# Patient Record
Sex: Male | Born: 1935 | Race: White | Hispanic: No | Marital: Married | Smoking: Never smoker
Health system: Southern US, Community
[De-identification: ages and names within clinical notes are randomized; demographics above are authoritative.]

## PROBLEM LIST (undated history)

## (undated) DIAGNOSIS — IMO0001 Reserved for inherently not codable concepts without codable children: Secondary | ICD-10-CM

## (undated) DIAGNOSIS — F419 Anxiety disorder, unspecified: Secondary | ICD-10-CM

## (undated) DIAGNOSIS — E785 Hyperlipidemia, unspecified: Secondary | ICD-10-CM

## (undated) DIAGNOSIS — K219 Gastro-esophageal reflux disease without esophagitis: Secondary | ICD-10-CM

## (undated) DIAGNOSIS — I219 Acute myocardial infarction, unspecified: Secondary | ICD-10-CM

## (undated) DIAGNOSIS — M722 Plantar fascial fibromatosis: Secondary | ICD-10-CM

## (undated) DIAGNOSIS — I2510 Atherosclerotic heart disease of native coronary artery without angina pectoris: Secondary | ICD-10-CM

## (undated) DIAGNOSIS — C189 Malignant neoplasm of colon, unspecified: Secondary | ICD-10-CM

## (undated) DIAGNOSIS — R079 Chest pain, unspecified: Secondary | ICD-10-CM

## (undated) DIAGNOSIS — I10 Essential (primary) hypertension: Secondary | ICD-10-CM

## (undated) DIAGNOSIS — W3400XA Accidental discharge from unspecified firearms or gun, initial encounter: Secondary | ICD-10-CM

## (undated) HISTORY — DX: Essential (primary) hypertension: I10

## (undated) HISTORY — DX: Chest pain, unspecified: R07.9

## (undated) HISTORY — PX: COLON SURGERY: SHX602

## (undated) HISTORY — PX: CORONARY ANGIOPLASTY WITH STENT PLACEMENT: SHX49

## (undated) HISTORY — DX: Plantar fascial fibromatosis: M72.2

## (undated) HISTORY — DX: Malignant neoplasm of colon, unspecified: C18.9

## (undated) HISTORY — PX: OTHER SURGICAL HISTORY: SHX169

## (undated) HISTORY — PX: CHOLECYSTECTOMY: SHX55

## (undated) HISTORY — DX: Accidental discharge from unspecified firearms or gun, initial encounter: W34.00XA

## (undated) HISTORY — DX: Gastro-esophageal reflux disease without esophagitis: K21.9

## (undated) HISTORY — DX: Reserved for inherently not codable concepts without codable children: IMO0001

## (undated) HISTORY — DX: Acute myocardial infarction, unspecified: I21.9

## (undated) HISTORY — DX: Anxiety disorder, unspecified: F41.9

## (undated) HISTORY — PX: BACK SURGERY: SHX140

---

## 2003-12-25 ENCOUNTER — Other Ambulatory Visit: Payer: Self-pay

## 2004-04-29 ENCOUNTER — Ambulatory Visit: Payer: Self-pay | Admitting: Otolaryngology

## 2004-05-04 ENCOUNTER — Ambulatory Visit: Payer: Self-pay | Admitting: Otolaryngology

## 2004-08-28 ENCOUNTER — Emergency Department: Payer: Self-pay | Admitting: Emergency Medicine

## 2004-11-11 ENCOUNTER — Ambulatory Visit: Payer: Self-pay | Admitting: Internal Medicine

## 2005-04-24 ENCOUNTER — Ambulatory Visit: Payer: Self-pay | Admitting: Gastroenterology

## 2005-07-19 ENCOUNTER — Inpatient Hospital Stay: Payer: Self-pay | Admitting: Internal Medicine

## 2005-07-19 ENCOUNTER — Other Ambulatory Visit: Payer: Self-pay

## 2005-08-31 ENCOUNTER — Ambulatory Visit: Payer: Self-pay | Admitting: Gastroenterology

## 2005-09-27 ENCOUNTER — Ambulatory Visit: Payer: Self-pay

## 2006-03-23 ENCOUNTER — Ambulatory Visit: Payer: Self-pay | Admitting: Internal Medicine

## 2006-04-04 ENCOUNTER — Inpatient Hospital Stay: Payer: Self-pay | Admitting: Internal Medicine

## 2006-04-04 ENCOUNTER — Other Ambulatory Visit: Payer: Self-pay

## 2006-04-20 ENCOUNTER — Emergency Department: Payer: Self-pay | Admitting: Emergency Medicine

## 2006-04-20 ENCOUNTER — Other Ambulatory Visit: Payer: Self-pay

## 2006-04-30 ENCOUNTER — Ambulatory Visit: Payer: Self-pay | Admitting: Gastroenterology

## 2006-05-09 ENCOUNTER — Other Ambulatory Visit: Payer: Self-pay

## 2006-05-10 ENCOUNTER — Inpatient Hospital Stay: Payer: Self-pay | Admitting: Internal Medicine

## 2006-07-20 ENCOUNTER — Ambulatory Visit: Payer: Self-pay | Admitting: Internal Medicine

## 2006-07-28 ENCOUNTER — Ambulatory Visit: Payer: Self-pay | Admitting: Internal Medicine

## 2006-10-05 ENCOUNTER — Other Ambulatory Visit: Payer: Self-pay

## 2006-10-05 ENCOUNTER — Emergency Department: Payer: Self-pay | Admitting: Emergency Medicine

## 2007-01-08 ENCOUNTER — Ambulatory Visit: Payer: Self-pay | Admitting: Internal Medicine

## 2007-08-15 ENCOUNTER — Ambulatory Visit: Payer: Self-pay | Admitting: Unknown Physician Specialty

## 2007-09-12 ENCOUNTER — Ambulatory Visit: Payer: Self-pay | Admitting: Gastroenterology

## 2009-05-28 ENCOUNTER — Ambulatory Visit: Payer: Self-pay | Admitting: Unknown Physician Specialty

## 2010-05-25 ENCOUNTER — Ambulatory Visit: Payer: Self-pay | Admitting: Internal Medicine

## 2010-06-21 ENCOUNTER — Ambulatory Visit: Payer: Self-pay | Admitting: Urology

## 2010-06-27 ENCOUNTER — Ambulatory Visit: Payer: Self-pay | Admitting: Urology

## 2010-09-21 ENCOUNTER — Ambulatory Visit: Payer: Self-pay | Admitting: Internal Medicine

## 2010-11-21 ENCOUNTER — Emergency Department: Payer: Self-pay | Admitting: Emergency Medicine

## 2011-05-25 ENCOUNTER — Ambulatory Visit: Payer: Self-pay | Admitting: Unknown Physician Specialty

## 2011-06-05 ENCOUNTER — Ambulatory Visit: Payer: Self-pay | Admitting: Unknown Physician Specialty

## 2011-06-05 LAB — PROTIME-INR: INR: 1.1

## 2012-05-02 ENCOUNTER — Ambulatory Visit: Payer: Self-pay | Admitting: Otolaryngology

## 2012-06-06 ENCOUNTER — Ambulatory Visit: Payer: Self-pay | Admitting: Internal Medicine

## 2012-09-26 ENCOUNTER — Ambulatory Visit: Payer: Self-pay | Admitting: Specialist

## 2012-09-26 LAB — CBC WITH DIFFERENTIAL/PLATELET
Basophil #: 0.1 10*3/uL (ref 0.0–0.1)
HCT: 35 % — ABNORMAL LOW (ref 40.0–52.0)
HGB: 12.4 g/dL — ABNORMAL LOW (ref 13.0–18.0)
Lymphocyte #: 2 10*3/uL (ref 1.0–3.6)
Lymphocyte %: 35.2 %
MCHC: 35.6 g/dL (ref 32.0–36.0)
MCV: 90 fL (ref 80–100)
RBC: 3.87 10*6/uL — ABNORMAL LOW (ref 4.40–5.90)
RDW: 14.3 % (ref 11.5–14.5)
WBC: 5.6 10*3/uL (ref 3.8–10.6)

## 2012-09-26 LAB — BASIC METABOLIC PANEL
Anion Gap: 6 — ABNORMAL LOW (ref 7–16)
BUN: 20 mg/dL — ABNORMAL HIGH (ref 7–18)
Calcium, Total: 9.3 mg/dL (ref 8.5–10.1)
Chloride: 109 mmol/L — ABNORMAL HIGH (ref 98–107)
Co2: 23 mmol/L (ref 21–32)
Creatinine: 0.83 mg/dL (ref 0.60–1.30)
EGFR (African American): >60
Glucose: 89 mg/dL (ref 65–99)

## 2012-10-02 ENCOUNTER — Ambulatory Visit: Payer: Self-pay | Admitting: Specialist

## 2013-02-05 ENCOUNTER — Ambulatory Visit: Payer: Self-pay | Admitting: Urology

## 2013-02-17 ENCOUNTER — Encounter: Payer: Self-pay | Admitting: Podiatry

## 2013-02-17 ENCOUNTER — Ambulatory Visit: Payer: Self-pay | Admitting: Podiatry

## 2013-03-12 ENCOUNTER — Encounter: Payer: Self-pay | Admitting: Podiatry

## 2013-03-12 ENCOUNTER — Ambulatory Visit (INDEPENDENT_AMBULATORY_CARE_PROVIDER_SITE_OTHER): Payer: Medicare PPO | Admitting: Podiatry

## 2013-03-12 VITALS — BP 91/44 | HR 57 | Resp 16 | Ht 70.0 in | Wt 215.0 lb

## 2013-03-12 DIAGNOSIS — M722 Plantar fascial fibromatosis: Secondary | ICD-10-CM

## 2013-03-12 DIAGNOSIS — IMO0002 Reserved for concepts with insufficient information to code with codable children: Secondary | ICD-10-CM

## 2013-03-12 DIAGNOSIS — M792 Neuralgia and neuritis, unspecified: Secondary | ICD-10-CM

## 2013-03-12 NOTE — Progress Notes (Signed)
Mr. Vanderwall presents today still complaining of pain to his right heel.  Objective: Vital signs are stable he is alert and oriented x3 pulses remain palpable to the right lower extremity. He has pain on palpation of the medial calcaneal tubercle of the right heel. This is indicative of plantar fasciitis or [name] nerve entrapment.  Assessment: Intractable plantar fasciitis right can't rule out a neuritis associated with [name] nerve entrapment.  Plan: He will continue to wear his orthotics a regular basis. At this point we did initiate alcohol injection therapy to the point of maximum tenderness today. I will followup with him in one month.

## 2013-04-09 ENCOUNTER — Ambulatory Visit: Payer: Medicare PPO | Admitting: Podiatry

## 2013-04-16 ENCOUNTER — Encounter: Payer: Self-pay | Admitting: Podiatry

## 2013-04-16 ENCOUNTER — Ambulatory Visit (INDEPENDENT_AMBULATORY_CARE_PROVIDER_SITE_OTHER): Payer: Medicare PPO | Admitting: Podiatry

## 2013-04-16 VITALS — BP 126/66 | HR 61 | Resp 16 | Ht 70.0 in | Wt 216.0 lb

## 2013-04-16 DIAGNOSIS — M792 Neuralgia and neuritis, unspecified: Secondary | ICD-10-CM

## 2013-04-16 DIAGNOSIS — M722 Plantar fascial fibromatosis: Secondary | ICD-10-CM

## 2013-04-16 DIAGNOSIS — IMO0002 Reserved for concepts with insufficient information to code with codable children: Secondary | ICD-10-CM

## 2013-04-16 NOTE — Progress Notes (Signed)
Mr. Arcia presents today for followup of his sclerosing therapy to the plantar aspect of his right heel. He states it has good days and bad days he seems to be better than it has been previously.  Objective: Vital signs are stable he is alert and oriented x3. He has mild tenderness on palpation of the fractures nerve at the plantar fascia of the right heel.  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  Plan: Second injection of dehydrated alcohol to his right heel. Followup with him in 3 week

## 2013-04-30 ENCOUNTER — Ambulatory Visit (INDEPENDENT_AMBULATORY_CARE_PROVIDER_SITE_OTHER): Payer: Medicare PPO | Admitting: Podiatry

## 2013-04-30 ENCOUNTER — Encounter: Payer: Self-pay | Admitting: Podiatry

## 2013-04-30 VITALS — BP 109/62 | HR 56 | Resp 16 | Ht 70.0 in | Wt 215.0 lb

## 2013-04-30 DIAGNOSIS — G5781 Other specified mononeuropathies of right lower limb: Secondary | ICD-10-CM

## 2013-04-30 DIAGNOSIS — G5760 Lesion of plantar nerve, unspecified lower limb: Secondary | ICD-10-CM

## 2013-04-30 NOTE — Progress Notes (Signed)
Larry Rosario presents today for followup of his neuritis to [name] nerve of his right heel. He states is about 30-40% better.  Objective: Vital signs are stable he is alert and oriented x3 pulses remain palpable right foot. Mild tenderness on palpation medial calcaneal tubercle of the right heel.  Assessment: [name] neuritis with plantar fasciitis right.  Plan: Injected dehydrated alcohol to the painful nerve right heel. Followup with him in 3 weeks

## 2013-05-21 ENCOUNTER — Encounter: Payer: Self-pay | Admitting: Podiatry

## 2013-05-21 ENCOUNTER — Ambulatory Visit (INDEPENDENT_AMBULATORY_CARE_PROVIDER_SITE_OTHER): Payer: Medicare PPO | Admitting: Podiatry

## 2013-05-21 VITALS — BP 116/66 | HR 70 | Resp 16

## 2013-05-21 DIAGNOSIS — IMO0002 Reserved for concepts with insufficient information to code with codable children: Secondary | ICD-10-CM

## 2013-05-21 DIAGNOSIS — M792 Neuralgia and neuritis, unspecified: Secondary | ICD-10-CM

## 2013-05-21 NOTE — Progress Notes (Signed)
   Subjective:    Patient ID: Larry Rosario, male    DOB: [birth year], 80 y.o.   MRN: [id number]  HPI Comments: Right heel still bothers some , but it is slowly getting better      Review of Systems     Objective:   Physical Exam: Vital signs are stable he is alert and oriented x3. Pulses are palpable right lower extremity. Minimal tenderness on palpation medial continued tubercle midline of the calcaneus.        Assessment & Plan:  Assessment: Neuritis or [name] nerve with fasciitis right.  Plan: Reinjected another dose of dehydrated alcohol today. All with him in 3 weeks. Remembered to ask him house tripped she [place] and [place] was.

## 2013-06-11 ENCOUNTER — Encounter: Payer: Self-pay | Admitting: Podiatry

## 2013-06-11 ENCOUNTER — Ambulatory Visit (INDEPENDENT_AMBULATORY_CARE_PROVIDER_SITE_OTHER): Payer: Medicare PPO | Admitting: Podiatry

## 2013-06-11 VITALS — BP 104/62 | HR 60 | Resp 16

## 2013-06-11 DIAGNOSIS — M792 Neuralgia and neuritis, unspecified: Secondary | ICD-10-CM

## 2013-06-11 DIAGNOSIS — IMO0002 Reserved for concepts with insufficient information to code with codable children: Secondary | ICD-10-CM
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
## 2013-07-02 ENCOUNTER — Ambulatory Visit: Payer: Medicare PPO | Admitting: Podiatry

## 2014-09-04 NOTE — Op Note (Signed)
PATIENT NAME:  Larry Rosario, Larry Rosario Boruch R MR#:  [id number] DATE OF BIRTH:  Mar 25, 1932  DATE OF PROCEDURE:  10/02/2012  PREOPERATIVE DIAGNOSIS: Left carpal tunnel syndrome.   POSTOPERATIVE DIAGNOSIS: Left carpal tunnel syndrome.   PROCEDURE: Left carpal tunnel release.   SURGEON: Christophe Louis, M.D.   ANESTHESIA: General.   COMPLICATIONS: None.   TOURNIQUET TIME: 17 minutes.   ESTIMATED BLOOD LOSS: None.   PROCEDURE: After adequate induction of general anesthesia the left upper extremity was thoroughly prepped with alcohol and ChloraPrep and draped in standard sterile fashion. The extremity was wrapped out with the Esmarch bandage and a pneumatic tourniquet elevated to 250 mmHg. Under loupe magnification, standard volar carpal tunnel incision was made and the dissection carried down to the transverse retinacular ligament. The ligament was incised with the knife in the mid-portion. The distal release was performed with the small scissors. The proximal release was performed with the small scissors and the carpal tunnel scissors.   A careful check was made both proximally and distally to ensure that complete release had been obtained. There was moderate compression of the nerve directly beneath the ligament. There was minimal synovitis present and no mass lesions.   The wound was thoroughly irrigated multiple times. Skin edges were infiltrated with 0.5% plain Marcaine. The skin was closed with 4-0 nylon. A soft bulky dressing was applied. The tourniquet was released and the patient was returned to the recovery room in satisfactory condition, having tolerated the procedure quite well.    ____________________________ Lucas Mallow, MD ces:dm D: 10/02/2012 08:24:08 ET T: 10/02/2012 08:30:32 ET JOB#: [id number]  cc: Lucas Mallow, MD, <Dictator> Lucas Mallow MD ELECTRONICALLY SIGNED 10/02/2012 10:12

## 2014-11-05 ENCOUNTER — Encounter: Payer: Self-pay | Admitting: *Deleted

## 2014-11-06 ENCOUNTER — Encounter: Payer: Self-pay | Admitting: Anesthesiology

## 2014-11-06 ENCOUNTER — Ambulatory Visit: Payer: Medicare PPO | Admitting: Anesthesiology

## 2014-11-06 ENCOUNTER — Ambulatory Visit
Admission: RE | Admit: 2014-11-06 | Discharge: 2014-11-06 | Disposition: A | Discharge status: Home / Self Care | Payer: Medicare PPO | Source: Ambulatory Visit | Attending: Unknown Physician Specialty | Admitting: Unknown Physician Specialty

## 2014-11-06 ENCOUNTER — Encounter
Admission: RE | Disposition: A | Discharge status: Home / Self Care | Payer: Self-pay | Source: Ambulatory Visit | Attending: Unknown Physician Specialty

## 2014-11-06 DIAGNOSIS — F419 Anxiety disorder, unspecified: Secondary | ICD-10-CM | POA: Insufficient documentation

## 2014-11-06 DIAGNOSIS — I2510 Atherosclerotic heart disease of native coronary artery without angina pectoris: Secondary | ICD-10-CM | POA: Diagnosis not present

## 2014-11-06 DIAGNOSIS — Z955 Presence of coronary angioplasty implant and graft: Secondary | ICD-10-CM | POA: Diagnosis not present

## 2014-11-06 DIAGNOSIS — Z7982 Long term (current) use of aspirin: Secondary | ICD-10-CM | POA: Diagnosis not present

## 2014-11-06 DIAGNOSIS — I252 Old myocardial infarction: Secondary | ICD-10-CM | POA: Diagnosis not present

## 2014-11-06 DIAGNOSIS — K219 Gastro-esophageal reflux disease without esophagitis: Secondary | ICD-10-CM | POA: Insufficient documentation

## 2014-11-06 DIAGNOSIS — Z08 Encounter for follow-up examination after completed treatment for malignant neoplasm: Secondary | ICD-10-CM | POA: Insufficient documentation

## 2014-11-06 DIAGNOSIS — Z85038 Personal history of other malignant neoplasm of large intestine: Secondary | ICD-10-CM | POA: Insufficient documentation

## 2014-11-06 DIAGNOSIS — K5730 Diverticulosis of large intestine without perforation or abscess without bleeding: Secondary | ICD-10-CM | POA: Insufficient documentation

## 2014-11-06 DIAGNOSIS — I10 Essential (primary) hypertension: Secondary | ICD-10-CM | POA: Diagnosis not present

## 2014-11-06 DIAGNOSIS — Z79899 Other long term (current) drug therapy: Secondary | ICD-10-CM | POA: Diagnosis not present

## 2014-11-06 DIAGNOSIS — E785 Hyperlipidemia, unspecified: Secondary | ICD-10-CM | POA: Insufficient documentation

## 2014-11-06 DIAGNOSIS — K640 First degree hemorrhoids: Secondary | ICD-10-CM | POA: Diagnosis not present

## 2014-11-06 HISTORY — DX: Atherosclerotic heart disease of native coronary artery without angina pectoris: I25.10

## 2014-11-06 HISTORY — DX: Gastro-esophageal reflux disease without esophagitis: K21.9

## 2014-11-06 HISTORY — PX: COLONOSCOPY WITH PROPOFOL: SHX5780

## 2014-11-06 HISTORY — DX: Hyperlipidemia, unspecified: E78.5

## 2014-11-06 SURGERY — COLONOSCOPY WITH PROPOFOL
Anesthesia: General

## 2014-11-06 MED ORDER — EPHEDRINE SULFATE 50 MG/ML IJ SOLN
INTRAMUSCULAR | Status: DC | PRN
  Administered 2014-11-06: 10 mg via INTRAVENOUS
  Administered 2014-11-06: 15 mg via INTRAVENOUS

## 2014-11-06 MED ORDER — SODIUM CHLORIDE 0.9 % IV SOLN: INTRAVENOUS | Status: DC

## 2014-11-06 MED ORDER — LIDOCAINE HCL (CARDIAC) 20 MG/ML IV SOLN
INTRAVENOUS | Status: DC | PRN
  Administered 2014-11-06: 60 mg via INTRAVENOUS

## 2014-11-06 MED ORDER — LACTATED RINGERS IV SOLN
INTRAVENOUS | Status: DC | PRN
  Administered 2014-11-06: 09:00:00 via INTRAVENOUS

## 2014-11-06 MED ORDER — MIDAZOLAM HCL 2 MG/2ML IJ SOLN
INTRAMUSCULAR | Status: DC | PRN
  Administered 2014-11-06: 1 mg via INTRAVENOUS

## 2014-11-06 MED ORDER — PROPOFOL INFUSION 10 MG/ML OPTIME
INTRAVENOUS | Status: DC | PRN
  Administered 2014-11-06: 140 ug/kg/min via INTRAVENOUS
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
## 2014-11-06 NOTE — Anesthesia Postprocedure Evaluation (Signed)
  Anesthesia Post-op Note  Patient: Larry Rosario  Procedure(s) Performed: Procedure(s): COLONOSCOPY WITH PROPOFOL (N/A)  Anesthesia type:General  Patient location: PACU  Post pain: Pain level controlled  Post assessment: Post-op Vital signs reviewed, Patient's Cardiovascular Status Stable, Respiratory Function Stable, Patent Airway and No signs of Nausea or vomiting  Post vital signs: Reviewed and stable  Last Vitals:  Filed Vitals:   11/06/14 1025  BP:   Pulse: 53  Temp:   Resp: 15    Level of consciousness: awake, alert  and patient cooperative  Complications: No apparent anesthesia complications

## 2014-11-06 NOTE — Transfer of Care (Signed)
Immediate Anesthesia Transfer of Care Note  Patient: Larry Rosario  Procedure(s) Performed: Procedure(s): COLONOSCOPY WITH PROPOFOL (N/A)  Patient Location: PACU and Endoscopy Unit  Anesthesia Type:General  Level of Consciousness: awake, alert  and oriented  Airway & Oxygen Therapy: Patient Spontanous Breathing and Patient connected to nasal cannula oxygen  Post-op Assessment: Report given to RN  Post vital signs: Reviewed and stable  Last Vitals: 55 hr 98% sat 112/59 18r Filed Vitals:   11/06/14 0901  BP: 116/61  Temp: 89.8 C    Complications: No apparent anesthesia complications

## 2014-11-06 NOTE — Op Note (Signed)
[facility] Gastroenterology Patient Name: Mirl Hillery Procedure Date: 11/06/2014 9:18 AM MRN: [id number] Account #: [id number] Date of Birth: 05-Nov-1931 Admit Type: Outpatient Age: 81 Room: [facility] ENDO ROOM 1 Gender: Male Note Status: Finalized Procedure:         Colonoscopy Indications:       Personal history of malignant neoplasm of the colon Providers:         Manya Silvas, MD Referring MD:      Hewitt Blade. Sarina Ser, MD (Referring MD) Medicines:         Propofol per Anesthesia Complications:     No immediate complications. Procedure:         Pre-Anesthesia Assessment:                    - After reviewing the risks and benefits, the patient was                     deemed in satisfactory condition to undergo the procedure.                    After obtaining informed consent, the colonoscope was                     passed under direct vision. Throughout the procedure, the                     patient's blood pressure, pulse, and oxygen saturations                     were monitored continuously. The Olympus PCF-160AL                     colonoscope (S#. [id number]) was introduced through the anus                     and advanced to the the ileocolonic anastomosis. The                     colonoscopy was performed without difficulty. The patient                     tolerated the procedure well. The quality of the bowel                     preparation was good. Findings:      Multiple small and large-mouthed diverticula were found in the sigmoid       colon, in the descending colon and in the transverse colon. Mostly small.      Internal hemorrhoids were found during endoscopy. The hemorrhoids were       small and Grade I (internal hemorrhoids that do not prolapse). Impression:        - Diverticulosis in the sigmoid colon, in the descending                     colon and in the transverse colon.                    - Internal hemorrhoids.                    - No  specimens collected. Recommendation:    - The findings and recommendations were discussed with the  patient's family. Manya Silvas, MD 11/06/2014 9:47:36 AM This report has been signed electronically. Number of Addenda: 0 Note Initiated On: 11/06/2014 9:18 AM Scope Withdrawal Time: 0 hours 9 minutes 46 seconds  Total Procedure Duration: 0 hours 14 minutes 48 seconds       [facility]
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
## 2014-11-09 ENCOUNTER — Encounter: Payer: Self-pay | Admitting: Unknown Physician Specialty

## 2015-03-24 ENCOUNTER — Other Ambulatory Visit: Payer: Self-pay | Admitting: Orthopedic Surgery

## 2015-03-24 DIAGNOSIS — M19011 Primary osteoarthritis, right shoulder: Secondary | ICD-10-CM

## 2015-04-05 ENCOUNTER — Ambulatory Visit
Admission: RE | Admit: 2015-04-05 | Discharge: 2015-04-05 | Disposition: A | Discharge status: Home / Self Care | Payer: Medicare PPO | Source: Ambulatory Visit | Attending: Orthopedic Surgery | Admitting: Orthopedic Surgery

## 2015-04-05 DIAGNOSIS — M19011 Primary osteoarthritis, right shoulder: Secondary | ICD-10-CM | POA: Insufficient documentation

## 2015-04-05 DIAGNOSIS — W19XXXA Unspecified fall, initial encounter: Secondary | ICD-10-CM | POA: Insufficient documentation

## 2015-04-05 DIAGNOSIS — M6281 Muscle weakness (generalized): Secondary | ICD-10-CM | POA: Insufficient documentation

## 2015-04-05 MED ORDER — IOHEXOL 180 MG/ML  SOLN: 5.0000 mL | Freq: Once | INTRAMUSCULAR | Status: DC | PRN

## 2015-10-13 ENCOUNTER — Other Ambulatory Visit: Payer: Self-pay | Admitting: Urology

## 2015-10-13 DIAGNOSIS — N401 Enlarged prostate with lower urinary tract symptoms: Secondary | ICD-10-CM

## 2015-10-13 DIAGNOSIS — N138 Other obstructive and reflux uropathy: Secondary | ICD-10-CM

## 2015-10-13 DIAGNOSIS — R351 Nocturia: Secondary | ICD-10-CM

## 2015-10-13 DIAGNOSIS — R3915 Urgency of urination: Secondary | ICD-10-CM

## 2015-10-13 DIAGNOSIS — R310 Gross hematuria: Secondary | ICD-10-CM

## 2015-10-13 DIAGNOSIS — N403 Nodular prostate with lower urinary tract symptoms: Secondary | ICD-10-CM

## 2015-10-13 DIAGNOSIS — R3914 Feeling of incomplete bladder emptying: Secondary | ICD-10-CM

## 2015-10-20 ENCOUNTER — Ambulatory Visit
Admission: RE | Admit: 2015-10-20 | Discharge: 2015-10-20 | Disposition: A | Discharge status: Home / Self Care | Payer: Medicare Other | Source: Ambulatory Visit | Attending: Urology | Admitting: Urology

## 2015-10-20 DIAGNOSIS — N401 Enlarged prostate with lower urinary tract symptoms: Secondary | ICD-10-CM

## 2015-10-20 DIAGNOSIS — E669 Obesity, unspecified: Secondary | ICD-10-CM | POA: Diagnosis not present

## 2015-10-20 DIAGNOSIS — N138 Other obstructive and reflux uropathy: Secondary | ICD-10-CM

## 2015-10-20 DIAGNOSIS — R310 Gross hematuria: Secondary | ICD-10-CM | POA: Diagnosis present

## 2015-10-20 DIAGNOSIS — N329 Bladder disorder, unspecified: Secondary | ICD-10-CM | POA: Diagnosis not present

## 2015-10-20 DIAGNOSIS — N1339 Other hydronephrosis: Secondary | ICD-10-CM | POA: Diagnosis not present

## 2015-10-20 DIAGNOSIS — R351 Nocturia: Secondary | ICD-10-CM

## 2015-10-20 DIAGNOSIS — R3915 Urgency of urination: Secondary | ICD-10-CM | POA: Diagnosis not present

## 2015-10-20 DIAGNOSIS — I2510 Atherosclerotic heart disease of native coronary artery without angina pectoris: Secondary | ICD-10-CM | POA: Insufficient documentation

## 2015-10-20 DIAGNOSIS — N403 Nodular prostate with lower urinary tract symptoms: Secondary | ICD-10-CM

## 2015-10-20 DIAGNOSIS — R932 Abnormal findings on diagnostic imaging of liver and biliary tract: Secondary | ICD-10-CM | POA: Insufficient documentation

## 2015-10-20 DIAGNOSIS — R3914 Feeling of incomplete bladder emptying: Secondary | ICD-10-CM | POA: Insufficient documentation

## 2015-10-20 LAB — POCT I-STAT CREATININE: Creatinine, Ser: 1.7 mg/dL — ABNORMAL HIGH (ref 0.61–1.24)

## 2015-10-20 MED ORDER — IOPAMIDOL (ISOVUE-300) INJECTION 61%
125.0000 mL | Freq: Once | INTRAVENOUS | Status: AC | PRN
  Administered 2015-10-20: 100 mL via INTRAVENOUS

## 2016-02-21 ENCOUNTER — Encounter
Admission: RE | Admit: 2016-02-21 | Discharge: 2016-02-21 | Disposition: A | Discharge status: Home / Self Care | Payer: Medicare PPO | Source: Ambulatory Visit | Attending: Internal Medicine | Admitting: Internal Medicine

## 2016-02-21 DIAGNOSIS — Z7901 Long term (current) use of anticoagulants: Secondary | ICD-10-CM | POA: Insufficient documentation

## 2016-02-21 DIAGNOSIS — I4891 Unspecified atrial fibrillation: Secondary | ICD-10-CM | POA: Insufficient documentation

## 2016-02-21 DIAGNOSIS — I509 Heart failure, unspecified: Secondary | ICD-10-CM | POA: Insufficient documentation

## 2016-02-24 DIAGNOSIS — I4891 Unspecified atrial fibrillation: Secondary | ICD-10-CM | POA: Diagnosis present

## 2016-02-24 DIAGNOSIS — I509 Heart failure, unspecified: Secondary | ICD-10-CM | POA: Diagnosis not present

## 2016-02-24 DIAGNOSIS — Z7901 Long term (current) use of anticoagulants: Secondary | ICD-10-CM | POA: Diagnosis not present

## 2016-02-24 LAB — PROTIME-INR
INR: 1.54
Prothrombin Time: 18.6 seconds — ABNORMAL HIGH (ref 11.4–15.2)

## 2016-02-25 ENCOUNTER — Other Ambulatory Visit
Admission: RE | Admit: 2016-02-25 | Discharge: 2016-02-25 | Disposition: A | Discharge status: Home / Self Care | Payer: Medicare Other | Source: Skilled Nursing Facility | Attending: Internal Medicine | Admitting: Internal Medicine

## 2016-02-25 DIAGNOSIS — I4891 Unspecified atrial fibrillation: Secondary | ICD-10-CM | POA: Insufficient documentation

## 2016-02-25 DIAGNOSIS — D649 Anemia, unspecified: Secondary | ICD-10-CM | POA: Insufficient documentation

## 2016-02-25 DIAGNOSIS — N189 Chronic kidney disease, unspecified: Secondary | ICD-10-CM | POA: Insufficient documentation

## 2016-02-25 LAB — CBC
HCT: 25 % — ABNORMAL LOW (ref 40.0–52.0)
HEMOGLOBIN: 8.6 g/dL — AB (ref 13.0–18.0)
MCH: 31.6 pg (ref 26.0–34.0)
MCHC: 34.5 g/dL (ref 32.0–36.0)
MCV: 91.5 fL (ref 80.0–100.0)
Platelets: 179 10*3/uL (ref 150–440)
RBC: 2.74 MIL/uL — AB (ref 4.40–5.90)
RDW: 17.7 % — ABNORMAL HIGH (ref 11.5–14.5)
WBC: 3.8 10*3/uL (ref 3.8–10.6)

## 2016-02-25 LAB — COMPREHENSIVE METABOLIC PANEL
ALBUMIN: 2.6 g/dL — AB (ref 3.5–5.0)
ALT: 16 U/L — ABNORMAL LOW (ref 17–63)
AST: 20 U/L (ref 15–41)
Alkaline Phosphatase: 39 U/L (ref 38–126)
Anion gap: 9 (ref 5–15)
BILIRUBIN TOTAL: 0.6 mg/dL (ref 0.3–1.2)
BUN: 13 mg/dL (ref 6–20)
CHLORIDE: 106 mmol/L (ref 101–111)
CO2: 26 mmol/L (ref 22–32)
Calcium: 8.2 mg/dL — ABNORMAL LOW (ref 8.9–10.3)
Creatinine, Ser: 1.4 mg/dL — ABNORMAL HIGH (ref 0.61–1.24)
GFR calc Af Amer: 52 mL/min — ABNORMAL LOW (ref >60)
GFR calc non Af Amer: 45 mL/min — ABNORMAL LOW (ref >60)
GLUCOSE: 88 mg/dL (ref 65–99)
POTASSIUM: 3.6 mmol/L (ref 3.5–5.1)
Sodium: 141 mmol/L (ref 135–145)
Total Protein: 5.8 g/dL — ABNORMAL LOW (ref 6.5–8.1)

## 2016-02-25 LAB — PROTIME-INR
INR: 1.61
PROTHROMBIN TIME: 19.3 s — AB (ref 11.4–15.2)

## 2016-02-28 ENCOUNTER — Non-Acute Institutional Stay (SKILLED_NURSING_FACILITY): Payer: Medicare Other | Admitting: Gerontology

## 2016-02-28 DIAGNOSIS — I4891 Unspecified atrial fibrillation: Secondary | ICD-10-CM | POA: Diagnosis not present

## 2016-02-28 DIAGNOSIS — Z5181 Encounter for therapeutic drug level monitoring: Secondary | ICD-10-CM | POA: Diagnosis not present

## 2016-02-28 DIAGNOSIS — Z48816 Encounter for surgical aftercare following surgery on the genitourinary system: Secondary | ICD-10-CM

## 2016-02-28 LAB — BASIC METABOLIC PANEL
ANION GAP: 6 (ref 5–15)
BUN: 15 mg/dL (ref 6–20)
CHLORIDE: 105 mmol/L (ref 101–111)
CO2: 26 mmol/L (ref 22–32)
CREATININE: 1.45 mg/dL — AB (ref 0.61–1.24)
Calcium: 8.4 mg/dL — ABNORMAL LOW (ref 8.9–10.3)
GFR calc non Af Amer: 43 mL/min — ABNORMAL LOW (ref >60)
GFR, EST AFRICAN AMERICAN: 50 mL/min — AB (ref >60)
Glucose, Bld: 93 mg/dL (ref 65–99)
POTASSIUM: 3.9 mmol/L (ref 3.5–5.1)
SODIUM: 137 mmol/L (ref 135–145)

## 2016-02-28 LAB — PROTIME-INR
INR: 2.4
PROTHROMBIN TIME: 26.6 s — AB (ref 11.4–15.2)
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
## 2016-11-27 ENCOUNTER — Other Ambulatory Visit: Payer: Self-pay | Admitting: Student

## 2016-11-27 DIAGNOSIS — W3400XA Accidental discharge from unspecified firearms or gun, initial encounter: Secondary | ICD-10-CM

## 2016-11-28 ENCOUNTER — Ambulatory Visit
Admission: RE | Admit: 2016-11-28 | Discharge: 2016-11-28 | Disposition: A | Discharge status: Home / Self Care | Payer: Medicare Other | Source: Ambulatory Visit | Attending: Student | Admitting: Student

## 2016-11-28 ENCOUNTER — Ambulatory Visit
Admission: RE | Admit: 2016-11-28 | Discharge: 2016-11-28 | Disposition: A | Discharge status: Home / Self Care | Payer: Medicare Other | Source: Ambulatory Visit | Attending: Family Medicine | Admitting: Family Medicine

## 2016-11-28 DIAGNOSIS — W3400XA Accidental discharge from unspecified firearms or gun, initial encounter: Secondary | ICD-10-CM | POA: Insufficient documentation

## 2016-11-28 DIAGNOSIS — Z01818 Encounter for other preprocedural examination: Secondary | ICD-10-CM | POA: Diagnosis present

## 2016-11-28 DIAGNOSIS — Z87828 Personal history of other (healed) physical injury and trauma: Secondary | ICD-10-CM | POA: Insufficient documentation

## 2016-11-29 ENCOUNTER — Other Ambulatory Visit: Payer: Self-pay | Admitting: Student

## 2016-11-29 DIAGNOSIS — M5416 Radiculopathy, lumbar region: Secondary | ICD-10-CM

## 2016-11-29 DIAGNOSIS — W3400XA Accidental discharge from unspecified firearms or gun, initial encounter: Secondary | ICD-10-CM

## 2016-12-12 ENCOUNTER — Ambulatory Visit
Admission: RE | Admit: 2016-12-12 | Discharge: 2016-12-12 | Disposition: A | Discharge status: Home / Self Care | Payer: Medicare Other | Source: Ambulatory Visit | Attending: Student | Admitting: Student

## 2016-12-12 DIAGNOSIS — Z905 Acquired absence of kidney: Secondary | ICD-10-CM | POA: Diagnosis not present

## 2016-12-12 DIAGNOSIS — M5126 Other intervertebral disc displacement, lumbar region: Secondary | ICD-10-CM | POA: Diagnosis not present

## 2016-12-12 DIAGNOSIS — M4316 Spondylolisthesis, lumbar region: Secondary | ICD-10-CM | POA: Insufficient documentation

## 2016-12-12 DIAGNOSIS — M48061 Spinal stenosis, lumbar region without neurogenic claudication: Secondary | ICD-10-CM | POA: Insufficient documentation

## 2016-12-12 DIAGNOSIS — M5416 Radiculopathy, lumbar region: Secondary | ICD-10-CM | POA: Insufficient documentation

## 2018-01-01 IMAGING — MR MR LUMBAR SPINE W/O CM
4 of 5 series · 25 of 48 positions shown · non-contrast
Comparison: None.

CLINICAL DATA: Lumbar radiculopathy

EXAM:
MRI LUMBAR SPINE WITHOUT CONTRAST
TECHNIQUE: Multiplanar, multisequence MR imaging of the lumbar spine was
performed. No intravenous contrast was administered.

[Series 2: T2 · sagittal · 4.0mm · 0.81mm/px · 7 of 17 slices shown (1 of 2)]
[im 1/17]
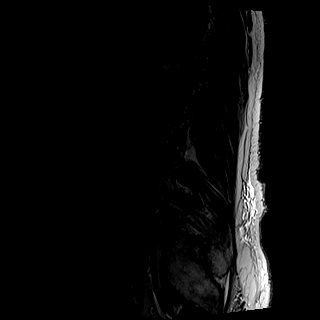
[im 3/17]
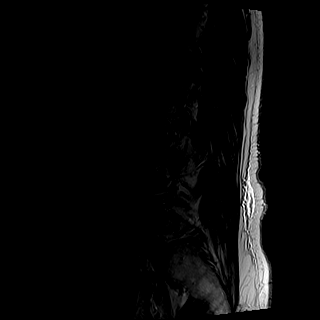
[im 6/17]
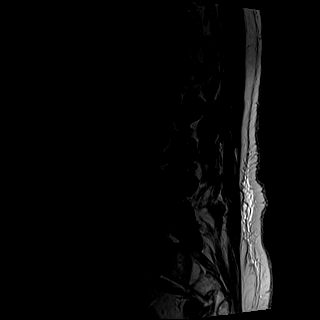
[im 9/17]
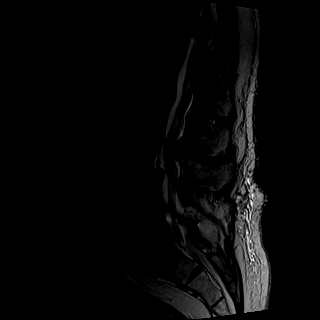
[im 11/17]
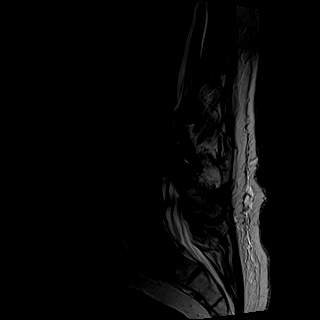
[im 14/17]
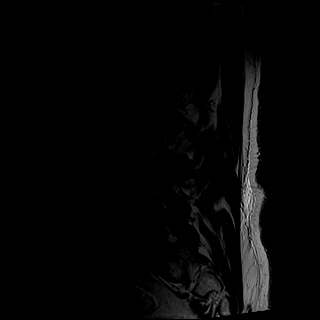
[im 17/17]
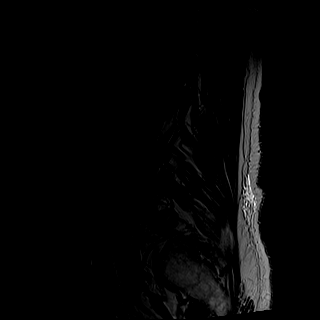

[Series 3: T1 · sagittal · 4.0mm · 0.41mm/px · 7 of 17 slices shown (1 of 2)]
[im 1/17]
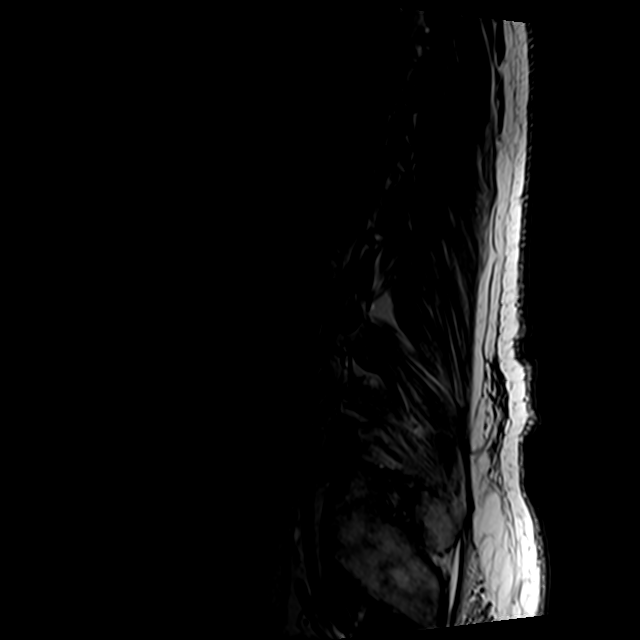
[im 3/17]
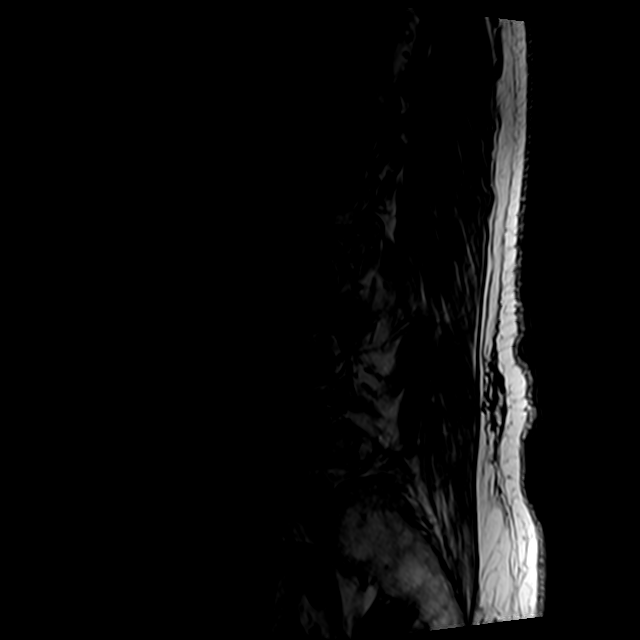
[im 6/17]
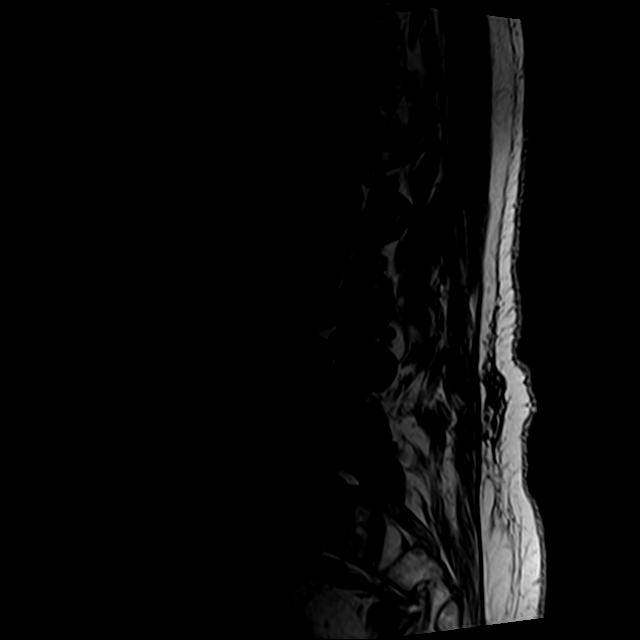
[im 9/17]
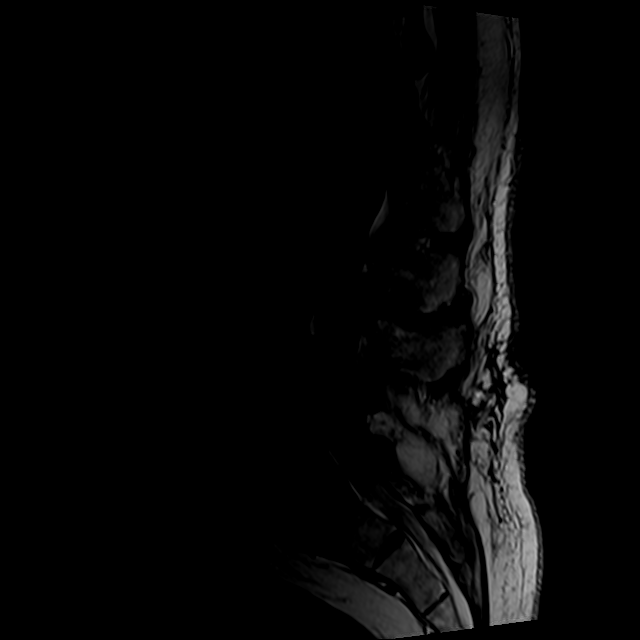
[im 11/17]
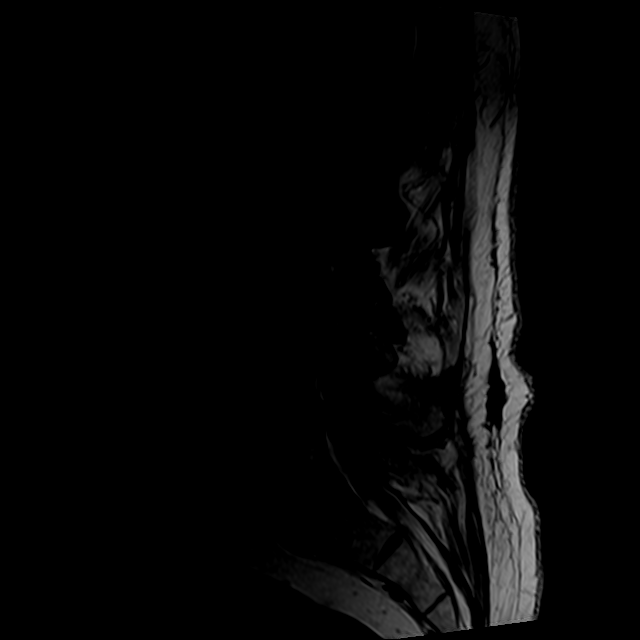
[im 14/17]
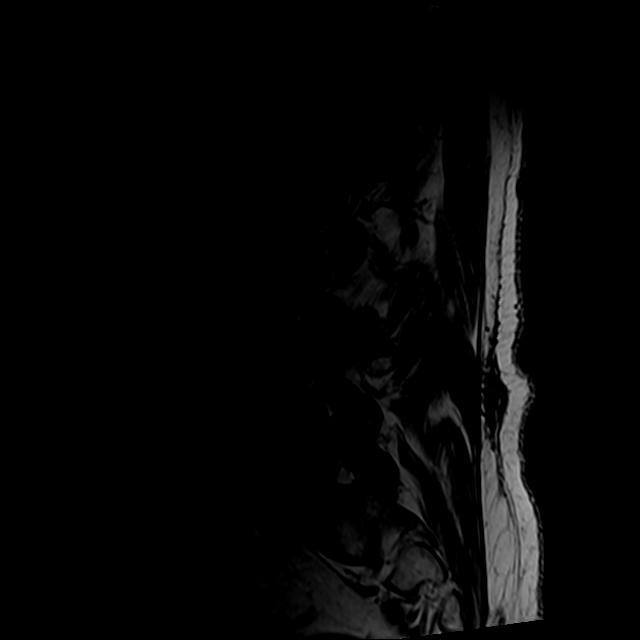
[im 17/17]
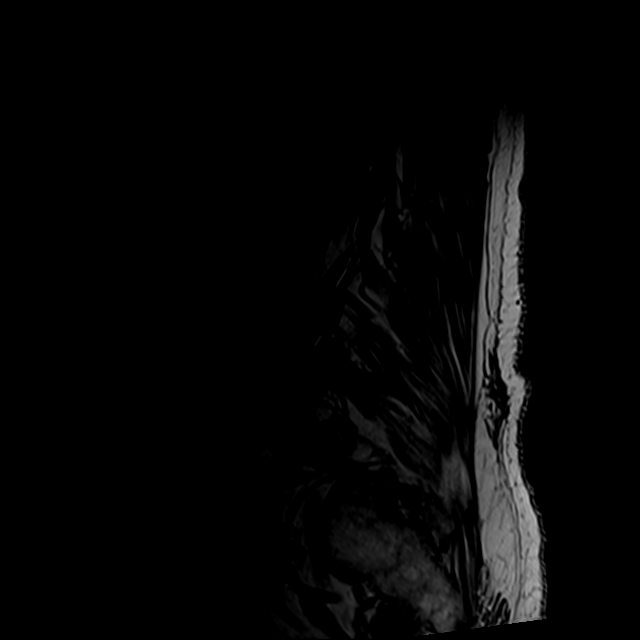

[Series 5: T2 · axial · 4.0mm · 0.78mm/px · z∈[-65,+149]mm · 8 of 38 slices shown (2 of 2)]
[im 1/38]
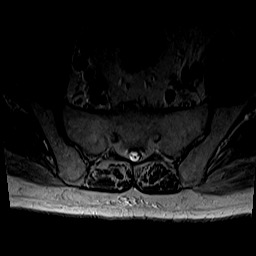
[im 6/38]
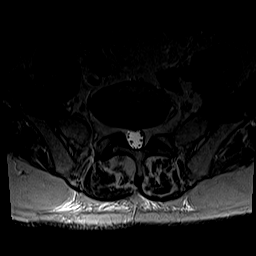
[im 12/38]
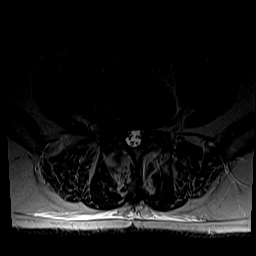
[im 18/38]
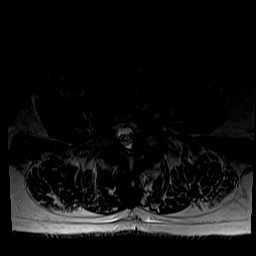
[im 20/38]
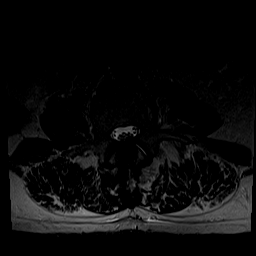
[im 26/38]
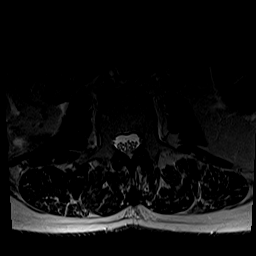
[im 32/38]
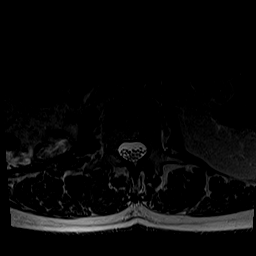
[im 38/38]
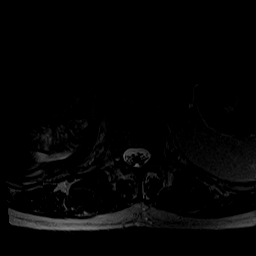

[Series 6: T1 · axial · 4.0mm · 0.39mm/px · z∈[-41,+119]mm · 3 of 38 slices shown (2 of 2)]
[im 6/38]
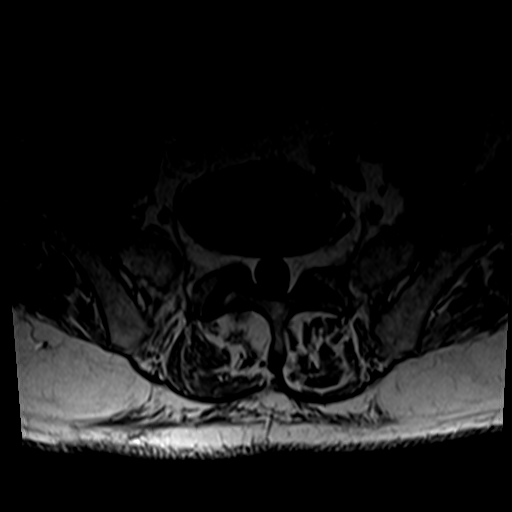
[im 20/38]
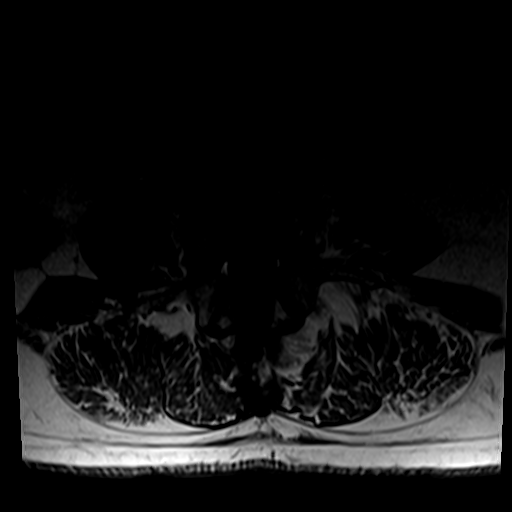
[im 32/38]
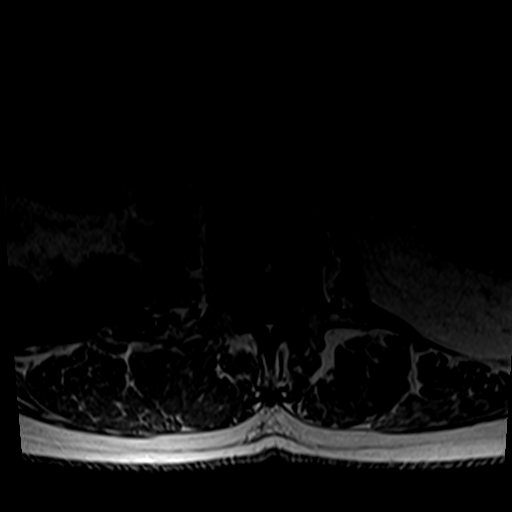

[25 of 48 positions shown; findings below may reference images not displayed]

FINDINGS: Segmentation:  Normal

Alignment: Mild retrolisthesis L1-2 and L2-3. 5 mm anterolisthesis
L3-4

Vertebrae: Negative for fracture or mass. Negative for metastatic
disease.

Conus medullaris: Extends to the L1 level and appears normal.

Paraspinal and other soft tissues: Right nephrectomy. No
retroperitoneal mass

Disc levels:

T12-L1:  Negative

L1-2:  Mild disc and facet degeneration.  Negative for stenosis

L2-3: Mild retrolisthesis. Diffuse disc bulging. Moderate facet and
ligamentum flavum hypertrophy causing mild spinal stenosis.
Subarticular and foraminal encroachment on the left moderate in
degree

L3-4: Severe disc degeneration left greater than right. 5 mm
anterolisthesis. Moderate facet hypertrophy bilaterally. Severe
subarticular and foraminal encroachment on the left with impingement
of the left L3 and L4 nerve roots. Mild spinal stenosis. Moderate
right foraminal encroachment

L4-5: Disc degeneration and spurring on the right. Severe right
foraminal encroachment due to foraminal disc protrusion and
spurring. Bilateral facet hypertrophy. Right laminectomy. Mild
spinal stenosis.

L5-S1:  Negative
IMPRESSION: Mild spinal stenosis L2-3 with moderate subarticular and foraminal
encroachment on the left

5 mm anterolisthesis L3-4. Severe subarticular and foraminal
encroachment on the left. Moderate right foraminal encroachment.
Mild spinal stenosis

Right laminectomy L4-5. Severe right foraminal encroachment due to
disc protrusion and spurring. Mild spinal stenosis.

## 2018-01-07 ENCOUNTER — Encounter
Admission: RE | Admit: 2018-01-07 | Discharge: 2018-01-07 | Disposition: A | Discharge status: Home / Self Care | Payer: Medicare Other | Source: Ambulatory Visit | Attending: Internal Medicine | Admitting: Internal Medicine

## 2018-01-08 ENCOUNTER — Other Ambulatory Visit
Admission: RE | Admit: 2018-01-08 | Discharge: 2018-01-08 | Disposition: A | Discharge status: Home / Self Care | Source: Ambulatory Visit | Attending: Internal Medicine | Admitting: Internal Medicine

## 2018-01-08 DIAGNOSIS — I480 Paroxysmal atrial fibrillation: Secondary | ICD-10-CM | POA: Diagnosis present

## 2018-01-08 LAB — PROTIME-INR
INR: 2.4
PROTHROMBIN TIME: 26 s — AB (ref 11.4–15.2)

## 2018-02-12 DEATH — deceased
